# Patient Record
Sex: Male | Born: 1955 | Race: White | Hispanic: No | Marital: Single | Smoking: Current every day smoker
Health system: Southern US, Community
[De-identification: ages and names within clinical notes are randomized; demographics above are authoritative.]

## PROBLEM LIST (undated history)

## (undated) DIAGNOSIS — J189 Pneumonia, unspecified organism: Secondary | ICD-10-CM

## (undated) DIAGNOSIS — Z79891 Long term (current) use of opiate analgesic: Secondary | ICD-10-CM

## (undated) DIAGNOSIS — I10 Essential (primary) hypertension: Secondary | ICD-10-CM

## (undated) DIAGNOSIS — Z87442 Personal history of urinary calculi: Secondary | ICD-10-CM

## (undated) DIAGNOSIS — S060X9A Concussion with loss of consciousness of unspecified duration, initial encounter: Secondary | ICD-10-CM

## (undated) DIAGNOSIS — C61 Malignant neoplasm of prostate: Secondary | ICD-10-CM

## (undated) DIAGNOSIS — S060XAA Concussion with loss of consciousness status unknown, initial encounter: Secondary | ICD-10-CM

## (undated) HISTORY — PX: OTHER SURGICAL HISTORY: SHX169

## (undated) HISTORY — PX: WISDOM TOOTH EXTRACTION: SHX21

## (undated) HISTORY — PX: ADENOIDECTOMY: SUR15

## (undated) HISTORY — PX: TONSILLECTOMY: SUR1361

## (undated) HISTORY — PX: PROSTATE BIOPSY: SHX241

---

## 1978-08-04 HISTORY — PX: OTHER SURGICAL HISTORY: SHX169

## 2010-08-04 DIAGNOSIS — M4846XA Fatigue fracture of vertebra, lumbar region, initial encounter for fracture: Secondary | ICD-10-CM

## 2010-08-04 HISTORY — DX: Fatigue fracture of vertebra, lumbar region, initial encounter for fracture: M48.46XA

## 2016-04-10 ENCOUNTER — Ambulatory Visit
Admit: 2016-04-10 | Discharge: 2016-04-10 | Payer: PRIVATE HEALTH INSURANCE | Attending: Family Medicine | Primary: Family Medicine

## 2016-04-10 DIAGNOSIS — M545 Low back pain, unspecified: Secondary | ICD-10-CM

## 2016-04-10 MED ORDER — TERAZOSIN 2 MG CAP
2 mg | ORAL_CAPSULE | Freq: Every evening | ORAL | 2 refills | Status: AC
Start: 2016-04-10 — End: ?

## 2016-04-10 MED ORDER — TRAZODONE 100 MG TAB
100 mg | ORAL_TABLET | Freq: Every evening | ORAL | 1 refills | Status: AC
Start: 2016-04-10 — End: ?

## 2016-04-10 MED ORDER — BUPROPION XL 300 MG 24 HR TAB
300 mg | ORAL_TABLET | ORAL | 1 refills | Status: AC
Start: 2016-04-10 — End: ?

## 2016-04-10 NOTE — Patient Instructions (Signed)
Call [employer] at [phone]. They will screen you for any insurance you might qualify for. This is the first step before you can receive discounts at the hospital or [facility] specialists.     Additional contact numbers for APA are below:  For [place] patients: [phone]  For [place] patients: [phone]  For [place] patients: [phone]

## 2016-04-10 NOTE — Progress Notes (Signed)
No chief complaint on file.    1. When and where did you last receive medical care? Yes When: 01/2016 [facility] Reason for visit: PCP    2.When and where did you last have preventive care such as mammogram, pap smears or colon screening? Colon: No    3. What is your current living situation (for example, live alone, live in home with immediate family members[place]    4. Do you have any problems with communication such trouble seeing, hearing, or understanding instructions?Yes - Hearing([place] ears)    5. Do you have an advance directive?  This is a document that you can give to family members with instructions for how you would want them to make health care decisions for you if you were unable to speak for yourself.  (For example, unconscious, delerious)No    PMH/FH/Social Hx reviewed and updated as needed     Applicable screenings reviewed and updated as needed  Medication reconciliation performed. Patient does need medication refills.  Health Maintenance reviewed.
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
## 2016-04-10 NOTE — Progress Notes (Signed)
Discharge instructions reviewed with patient    Medication list and understanding of medications reviewed with patient.   OTC and herbal medications reviewed and added to med list if applicable  Barriers to adherence assessed.    Guidance given regarding new medications this visit, including reason for taking this medicine, and common side effects.    Appointment made for MRI of Lumbar spine, given info on [employer] insurance and [facility] resources, also [facility] [employer] , Given AVS after signing a ROI for medical records from [facility] [employer]Marland Kitchen

## 2016-04-16 ENCOUNTER — Inpatient Hospital Stay: Admit: 2016-04-16 | Payer: Self-pay | Attending: Family Medicine | Primary: Family Medicine

## 2016-04-16 DIAGNOSIS — M4856XA Collapsed vertebra, not elsewhere classified, lumbar region, initial encounter for fracture: Secondary | ICD-10-CM

## 2016-04-16 NOTE — Progress Notes (Signed)
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  mulilevel foraminal narrowing.     Cervical xray 2012 showed mild DJD C4/5, C5/6 and C6/7  Thoracic spine xray 2012 shows mild scoliosis  Also CT head, CT abdomen pelvis, KUB and various CXR    All placed in scan queue for "media" tab

## 2016-04-24 ENCOUNTER — Ambulatory Visit
Admit: 2016-04-24 | Discharge: 2016-04-24 | Payer: PRIVATE HEALTH INSURANCE | Attending: Family Medicine | Primary: Family Medicine

## 2016-04-24 DIAGNOSIS — M545 Low back pain, unspecified: Secondary | ICD-10-CM

## 2016-04-24 NOTE — Progress Notes (Signed)
HPI  Kenneth Cordova is a 60 y.o. male being seen today for   Chief Complaint   Patient presents with   ??? Generalized Body Aches   .  he states that he has 2 days of body aches and headache.  Some cough.  No fever. +nasal drainage.   He states he got a flu shot in June.     Dizzy a few times last week when stood from sitting position.  He did start terazosin and iis is helping his urinarion issues.     Still with back pain. Prevents him from walking more than 30min but he is trying to walk and lose weight.  Mri showed diffuse djd and foraminal narrowing as well as old compression fracture.      Past Medical History:   Diagnosis Date   ??? Arthritis    ??? Chronic pain    ??? Depression    ??? Hypertension 2014   ??? Polycythemia 2015         ROS  Patient states that he is feeling well. Denies complaints of chest pain, shortness of breath, swelling of legs, dizziness or weakness. he denies nausea, vomiting or diarrhea.        Current Outpatient Prescriptions   Medication Sig   ??? nebivolol (BYSTOLIC) 20 mg tablet Take  by mouth daily.   ??? ibuprofen (MOTRIN) 800 mg tablet Take 800 mg by mouth three (3) times daily.   ??? busPIRone (BUSPAR) 10 mg tablet Take 10 mg by mouth three (3) times daily.   ??? citalopram (CELEXA) 40 mg tablet Take 40 mg by mouth daily.   ??? lidocaine (LIDODERM) 5 % by TransDERmal route every twenty-four (24) hours. Apply patch to the affected area for 12 hours a day and remove for 12 hours a day.   ??? dextroamphetamine-amphetamine (ADDERALL) 20 mg tablet Take 20 mg by mouth two (2) times a day.   ??? buPROPion XL (WELLBUTRIN XL) 300 mg XL tablet Take 1 Tab by mouth every morning.   ??? traZODone (DESYREL) 100 mg tablet Take 1 Tab by mouth nightly.   ??? terazosin (HYTRIN) 2 mg capsule Take 1 Cap by mouth nightly.     No current facility-administered medications for this visit.        PE  Visit Vitals   ??? BP (!) 162/94 (BP 1 Location: Left arm, BP Patient Position: Sitting)   ??? Pulse 67    ??? Temp 97.9 ??F (36.6 ??C) (Oral)   ??? Resp 16   ??? Ht 6\' 3"  (1.905 m)   ??? Wt 289 lb (131.1 kg)   ??? SpO2 98%   ??? BMI 36.12 kg/m2        Alert and oriented with normal mood and affect. he is well developed and well nourished . Lungs are clear without wheezing. Heart rate is regular without murmurs or gallops. There is no lower extremity edema.     No results found for this or any previous visit.      Assessment and Plan:        ICD-10-CM ICD-9-CM    1. Low back pain at multiple sites M54.5 724.2    2. Viral upper respiratory tract infection  Should be self limited J06.9 465.9     B97.89     3. Benign prostatic hyperplasia, presence of lower urinary tract symptoms unspecified, unspecified morphology  Improved with terazosin.  Dizziness is probably med side effect but should resolve. N40.0 600.00  4. Essential hypertension I10 401.9        apa for financial screening and then will refer to spine centter  Unclear if there are surgical options and I discussed that with him.   Work hard on weight loss which I think will help him the most    Continue terazosin and monitor his dizzy spells for now.    Donnie MesaEmily W Shivangi Lutz, MD

## 2016-04-24 NOTE — Patient Instructions (Signed)
Call [employer] Patient [employer]ocacy at [phone]. They will screen you for any insurance you might qualify for. This is the first step before you can receive discounts at the hospital or [facility] specialists.     Additional contact numbers for APA are below:  For [place] patients: [phone]  For [place] patients: [phone]  For [place] patients: [phone]

## 2016-04-24 NOTE — Progress Notes (Signed)
No chief complaint on file.    1. Have you been to the ER, urgent care clinic since your last visit?  Hospitalized since your last visit?No    2. Have you seen or consulted any other health care providers outside of the [facility] since your last visit? No    3.   When was your last Pap smear? No  When was your last Mammogram? No  When was your last Colon screening?No    PMH/FH/Social Hx reviewed and updated as needed      Applicable screenings reviewed and updated as needed  Medication reconciliation performed. Patient does need medication refills.  Health Maintenance reviewed.

## 2016-04-25 MED ORDER — GABAPENTIN 300 MG CAP
300 mg | ORAL_CAPSULE | Freq: Three times a day (TID) | ORAL | 2 refills | Status: AC
Start: 2016-04-25 — End: ?

## 2016-05-01 DIAGNOSIS — Z8679 Personal history of other diseases of the circulatory system: Secondary | ICD-10-CM

## 2016-05-01 HISTORY — DX: Personal history of other diseases of the circulatory system: Z86.79

## 2016-06-05 ENCOUNTER — Ambulatory Visit
Admit: 2016-06-05 | Discharge: 2016-06-05 | Payer: PRIVATE HEALTH INSURANCE | Attending: Family Medicine | Primary: Family Medicine

## 2016-06-05 NOTE — Progress Notes (Signed)
A user error has taken place: encounter opened in error, closed for administrative reasons.

## 2019-12-08 ENCOUNTER — Other Ambulatory Visit: Payer: Self-pay | Admitting: Urology

## 2019-12-08 DIAGNOSIS — C61 Malignant neoplasm of prostate: Secondary | ICD-10-CM

## 2020-01-04 ENCOUNTER — Other Ambulatory Visit: Payer: Self-pay | Admitting: Urology

## 2020-01-04 DIAGNOSIS — C61 Malignant neoplasm of prostate: Secondary | ICD-10-CM

## 2020-01-04 DIAGNOSIS — C779 Secondary and unspecified malignant neoplasm of lymph node, unspecified: Secondary | ICD-10-CM

## 2020-04-27 ENCOUNTER — Ambulatory Visit
Admission: RE | Admit: 2020-04-27 | Discharge: 2020-04-27 | Disposition: A | Discharge status: Home / Self Care | Payer: Medicare PPO | Source: Ambulatory Visit | Attending: Urology | Admitting: Urology

## 2020-04-27 ENCOUNTER — Other Ambulatory Visit: Payer: Self-pay | Admitting: Urology

## 2020-04-27 ENCOUNTER — Other Ambulatory Visit: Payer: Self-pay

## 2020-04-27 DIAGNOSIS — C61 Malignant neoplasm of prostate: Secondary | ICD-10-CM

## 2020-04-27 DIAGNOSIS — C779 Secondary and unspecified malignant neoplasm of lymph node, unspecified: Secondary | ICD-10-CM

## 2020-04-27 MED ORDER — GADOBENATE DIMEGLUMINE 529 MG/ML IV SOLN
20.0000 mL | Freq: Once | INTRAVENOUS | Status: AC | PRN
  Administered 2020-04-27: 20 mL via INTRAVENOUS

## 2020-05-03 ENCOUNTER — Other Ambulatory Visit: Payer: Medicare PPO

## 2020-05-03 ENCOUNTER — Ambulatory Visit
Admission: RE | Admit: 2020-05-03 | Discharge: 2020-05-03 | Disposition: A | Discharge status: Home / Self Care | Payer: Medicare PPO | Source: Ambulatory Visit | Attending: Urology | Admitting: Urology

## 2020-05-03 ENCOUNTER — Other Ambulatory Visit: Payer: Self-pay

## 2020-05-03 DIAGNOSIS — C779 Secondary and unspecified malignant neoplasm of lymph node, unspecified: Secondary | ICD-10-CM

## 2020-05-03 DIAGNOSIS — C61 Malignant neoplasm of prostate: Secondary | ICD-10-CM

## 2020-05-03 MED ORDER — GADOBENATE DIMEGLUMINE 529 MG/ML IV SOLN
20.0000 mL | Freq: Once | INTRAVENOUS | Status: AC | PRN
  Administered 2020-05-03: 20 mL via INTRAVENOUS

## 2020-10-08 ENCOUNTER — Encounter: Payer: Self-pay | Admitting: Radiation Oncology

## 2020-10-08 DIAGNOSIS — D751 Secondary polycythemia: Secondary | ICD-10-CM | POA: Insufficient documentation

## 2020-10-08 DIAGNOSIS — F329 Major depressive disorder, single episode, unspecified: Secondary | ICD-10-CM | POA: Insufficient documentation

## 2020-10-08 DIAGNOSIS — F99 Mental disorder, not otherwise specified: Secondary | ICD-10-CM | POA: Insufficient documentation

## 2020-10-08 DIAGNOSIS — F988 Other specified behavioral and emotional disorders with onset usually occurring in childhood and adolescence: Secondary | ICD-10-CM | POA: Insufficient documentation

## 2020-10-08 DIAGNOSIS — F5105 Insomnia due to other mental disorder: Secondary | ICD-10-CM | POA: Insufficient documentation

## 2020-10-08 DIAGNOSIS — F418 Other specified anxiety disorders: Secondary | ICD-10-CM | POA: Insufficient documentation

## 2020-10-08 DIAGNOSIS — Z79891 Long term (current) use of opiate analgesic: Secondary | ICD-10-CM | POA: Insufficient documentation

## 2020-10-08 DIAGNOSIS — F39 Unspecified mood [affective] disorder: Secondary | ICD-10-CM | POA: Insufficient documentation

## 2020-10-08 DIAGNOSIS — G4733 Obstructive sleep apnea (adult) (pediatric): Secondary | ICD-10-CM | POA: Insufficient documentation

## 2020-10-08 DIAGNOSIS — M5430 Sciatica, unspecified side: Secondary | ICD-10-CM | POA: Insufficient documentation

## 2020-10-08 DIAGNOSIS — M549 Dorsalgia, unspecified: Secondary | ICD-10-CM | POA: Insufficient documentation

## 2020-10-08 DIAGNOSIS — G4700 Insomnia, unspecified: Secondary | ICD-10-CM | POA: Insufficient documentation

## 2020-10-08 DIAGNOSIS — E669 Obesity, unspecified: Secondary | ICD-10-CM | POA: Insufficient documentation

## 2020-10-08 DIAGNOSIS — C61 Malignant neoplasm of prostate: Secondary | ICD-10-CM | POA: Insufficient documentation

## 2020-10-08 DIAGNOSIS — M810 Age-related osteoporosis without current pathological fracture: Secondary | ICD-10-CM | POA: Insufficient documentation

## 2020-10-08 DIAGNOSIS — F17200 Nicotine dependence, unspecified, uncomplicated: Secondary | ICD-10-CM | POA: Insufficient documentation

## 2020-10-08 DIAGNOSIS — E559 Vitamin D deficiency, unspecified: Secondary | ICD-10-CM | POA: Insufficient documentation
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
## 2020-10-09 ENCOUNTER — Encounter: Payer: Self-pay | Admitting: Radiation Oncology

## 2020-10-09 ENCOUNTER — Other Ambulatory Visit: Payer: Self-pay

## 2020-10-09 ENCOUNTER — Ambulatory Visit
Admission: RE | Admit: 2020-10-09 | Discharge: 2020-10-09 | Disposition: A | Discharge status: Home / Self Care | Payer: Medicare PPO | Source: Ambulatory Visit | Attending: Radiation Oncology | Admitting: Radiation Oncology

## 2020-10-09 VITALS — BP 156/79 | HR 110 | Temp 97.2°F | Resp 22 | Ht 75.0 in | Wt 275.1 lb

## 2020-10-09 DIAGNOSIS — Z79899 Other long term (current) drug therapy: Secondary | ICD-10-CM | POA: Diagnosis not present

## 2020-10-09 DIAGNOSIS — Z808 Family history of malignant neoplasm of other organs or systems: Secondary | ICD-10-CM | POA: Diagnosis not present

## 2020-10-09 DIAGNOSIS — C61 Malignant neoplasm of prostate: Secondary | ICD-10-CM | POA: Insufficient documentation

## 2020-10-09 HISTORY — DX: Malignant neoplasm of prostate: C61
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
## 2020-10-10 ENCOUNTER — Encounter: Payer: Self-pay | Admitting: Licensed Clinical Social Worker

## 2020-10-10 NOTE — Progress Notes (Signed)
[facility] Psychosocial Distress Screening Clinical Social Work  Clinical Social Work was referred by distress screening protocol.  The patient scored a 8 on the Psychosocial Distress Thermometer which indicates severe distress. Clinical Social Worker attempted to contact patient by phone to assess for distress and other psychosocial needs.   No answer. Left detailed message including direct contact information on identified voicemail.  ON[facility]N DISTRESS SCREENING 10/09/2020  Screening Type Initial Screening  Distress experienced in past week (1-10) 8  Practical problem type Housing;Insurance  Family Problem type Other (comment)  Emotional problem type Depression;Nervousness/Anxiety;Adjusting to illness;Isolation/feeling alone;Boredom  Spiritual/Religous concerns type Facing my mortality;Loss of sense of purpose  Information Concerns Type Lack of info about treatment;Lack of info about maintaining fitness  Physical Problem type Nausea/vomiting;Pain;Sleep/insomnia;Breathing;Sexual problems  Physician notified of physical symptoms Yes  Referral to clinical social work Yes  Referral to dietition No  Referral to financial advocate No  Referral to support programs Yes  Referral to palliative care No      [place], LCSW

## 2020-10-11 NOTE — Progress Notes (Signed)
[facility] Clinical Social Work   Second attempt to reach patient by phone. No answer. Left VM with direct contact information.   Christeen Douglas, LCSW

## 2020-10-16 ENCOUNTER — Telehealth: Payer: Self-pay | Admitting: *Deleted

## 2020-10-16 NOTE — Telephone Encounter (Signed)
CALLED PATIENT TO UPDATE, LVM FOR A RETURN CALL 

## 2020-11-28 ENCOUNTER — Telehealth: Payer: Self-pay | Admitting: *Deleted

## 2020-11-28 ENCOUNTER — Other Ambulatory Visit: Payer: Self-pay | Admitting: Urology

## 2020-11-28 NOTE — Telephone Encounter (Signed)
CALLED PATIENT TO REMIND OF PRE-SEED APPTS.FOR 11-29-20, SPOKE WITH PATIENT AND HE IS AWARE OF THESE APPTS. 

## 2020-11-29 ENCOUNTER — Ambulatory Visit
Admission: RE | Admit: 2020-11-29 | Discharge: 2020-11-29 | Disposition: A | Discharge status: Home / Self Care | Payer: Medicare PPO | Source: Ambulatory Visit | Attending: Urology | Admitting: Urology

## 2020-11-29 ENCOUNTER — Ambulatory Visit
Admission: RE | Admit: 2020-11-29 | Discharge: 2020-11-29 | Disposition: A | Discharge status: Home / Self Care | Payer: Medicare PPO | Source: Ambulatory Visit | Attending: Radiation Oncology | Admitting: Radiation Oncology

## 2020-11-29 ENCOUNTER — Ambulatory Visit (HOSPITAL_COMMUNITY)
Admission: RE | Admit: 2020-11-29 | Discharge: 2020-11-29 | Disposition: A | Discharge status: Home / Self Care | Payer: Medicare PPO | Source: Ambulatory Visit | Attending: Urology | Admitting: Urology

## 2020-11-29 ENCOUNTER — Encounter (HOSPITAL_COMMUNITY)
Admission: RE | Admit: 2020-11-29 | Discharge: 2020-11-29 | Disposition: A | Discharge status: Home / Self Care | Payer: Medicare PPO | Source: Ambulatory Visit | Attending: Urology | Admitting: Urology

## 2020-11-29 ENCOUNTER — Other Ambulatory Visit: Payer: Self-pay

## 2020-11-29 DIAGNOSIS — C61 Malignant neoplasm of prostate: Secondary | ICD-10-CM | POA: Diagnosis present

## 2020-11-29 DIAGNOSIS — Z01818 Encounter for other preprocedural examination: Secondary | ICD-10-CM | POA: Insufficient documentation

## 2020-11-29 NOTE — Progress Notes (Signed)
  Radiation Oncology         [phone] ________________________________  Name: Russell Irwin MRN: [id number]  Date: 11/29/2020  DOB: Jun 30, 1956  SIMULATION AND TREATMENT PLANNING NOTE PUBIC ARCH STUDY  CC:Pcp, No  [name], MD  DIAGNOSIS: 65 y.o. gentleman with Stage T1c adenocarcinoma of the prostate with Gleason score of 3+4, and PSA of 3.68.  Oncology History  Malignant neoplasm of prostate ([place])  07/02/2020 Cancer Staging   Staging form: Prostate, AJCC 8th Edition - Clinical stage from 07/02/2020: Stage IIB (cT1c, cN0, cM0, PSA: 3.2, Grade Group: 2) - Signed by [name], PA-C on 10/11/2020 Histopathologic type: Adenocarcinoma, NOS Stage prefix: Initial diagnosis Prostate specific antigen (PSA) range: Less than 10 Gleason primary pattern: 3 Gleason secondary pattern: 4 Gleason score: 7 Histologic grading system: 5 grade system Number of biopsy cores examined: 16 Number of biopsy cores positive: 7 Location of positive needle core biopsies: One side   10/08/2020 Initial Diagnosis   Malignant neoplasm of prostate ([place])       ICD-10-CM   1. Malignant neoplasm of prostate ([place])  C61     COMPLEX SIMULATION:  The patient presented today for evaluation for possible prostate seed implant. He was brought to the radiation planning suite and placed supine on the CT couch. A 3-dimensional image study set was obtained in upload to the planning computer. There, on each axial slice, I contoured the prostate gland. Then, using three-dimensional radiation planning tools I reconstructed the prostate in view of the structures from the transperineal needle pathway to assess for possible pubic arch interference. In doing so, I did not appreciate any pubic arch interference. Also, the patient's prostate volume was estimated based on the drawn structure. The volume was 34 cc.  Given the pubic arch appearance and prostate volume, patient remains a good candidate to proceed with prostate  seed implant. Today, he freely provided informed written consent to proceed.    PLAN: The patient will undergo prostate seed implant.   ________________________________  Sheral Apley. Tammi Klippel, M.D.

## 2021-01-16 ENCOUNTER — Telehealth: Payer: Self-pay | Admitting: *Deleted

## 2021-01-16 NOTE — Telephone Encounter (Signed)
CALLED PATIENT TO REMIND OF LABS FOR 01-21-21, SPOKE WITH PATIENT AND HE IS AWARE OF THIS APPT.

## 2021-01-21 ENCOUNTER — Encounter (HOSPITAL_COMMUNITY)
Admission: RE | Admit: 2021-01-21 | Discharge: 2021-01-21 | Disposition: A | Discharge status: Home / Self Care | Payer: Medicare PPO | Source: Ambulatory Visit | Attending: Urology | Admitting: Urology

## 2021-01-21 ENCOUNTER — Encounter (HOSPITAL_BASED_OUTPATIENT_CLINIC_OR_DEPARTMENT_OTHER): Payer: Self-pay | Admitting: Urology

## 2021-01-21 ENCOUNTER — Other Ambulatory Visit: Payer: Self-pay

## 2021-01-21 DIAGNOSIS — M549 Dorsalgia, unspecified: Secondary | ICD-10-CM

## 2021-01-21 DIAGNOSIS — G8929 Other chronic pain: Secondary | ICD-10-CM

## 2021-01-21 DIAGNOSIS — Z01812 Encounter for preprocedural laboratory examination: Secondary | ICD-10-CM | POA: Diagnosis not present

## 2021-01-21 DIAGNOSIS — M4306 Spondylolysis, lumbar region: Secondary | ICD-10-CM

## 2021-01-21 HISTORY — DX: Other chronic pain: G89.29

## 2021-01-21 HISTORY — DX: Spondylolysis, lumbar region: M43.06

## 2021-01-21 HISTORY — DX: Dorsalgia, unspecified: M54.9

## 2021-01-21 LAB — CBC
HCT: 55.9 % — ABNORMAL HIGH (ref 39.0–52.0)
Hemoglobin: 18.7 g/dL — ABNORMAL HIGH (ref 13.0–17.0)
MCH: 32.5 pg (ref 26.0–34.0)
MCHC: 33.5 g/dL (ref 30.0–36.0)
MCV: 97.2 fL (ref 80.0–100.0)
Platelets: 173 10*3/uL (ref 150–400)
RBC: 5.75 MIL/uL (ref 4.22–5.81)
RDW: 13.5 % (ref 11.5–15.5)
WBC: 6.6 10*3/uL (ref 4.0–10.5)
nRBC: 0 % (ref 0.0–0.2)

## 2021-01-21 LAB — COMPREHENSIVE METABOLIC PANEL
ALT: 23 U/L (ref 0–44)
AST: 16 U/L (ref 15–41)
Albumin: 4.3 g/dL (ref 3.5–5.0)
Alkaline Phosphatase: 83 U/L (ref 38–126)
Anion gap: 5 (ref 5–15)
BUN: 16 mg/dL (ref 8–23)
CO2: 32 mmol/L (ref 22–32)
Calcium: 9.2 mg/dL (ref 8.9–10.3)
Chloride: 100 mmol/L (ref 98–111)
Creatinine, Ser: 0.9 mg/dL (ref 0.61–1.24)
GFR, Estimated: >60 mL/min (ref >60)
Glucose, Bld: 127 mg/dL — ABNORMAL HIGH (ref 70–99)
Potassium: 5.2 mmol/L — ABNORMAL HIGH (ref 3.5–5.1)
Sodium: 137 mmol/L (ref 135–145)
Total Bilirubin: 0.4 mg/dL (ref 0.3–1.2)
Total Protein: 7.7 g/dL (ref 6.5–8.1)

## 2021-01-21 LAB — PROTIME-INR
INR: 1 (ref 0.8–1.2)
Prothrombin Time: 13 seconds (ref 11.4–15.2)

## 2021-01-21 LAB — APTT: aPTT: 30 seconds (ref 24–36)

## 2021-01-22 ENCOUNTER — Other Ambulatory Visit: Payer: Self-pay

## 2021-01-22 ENCOUNTER — Encounter (HOSPITAL_BASED_OUTPATIENT_CLINIC_OR_DEPARTMENT_OTHER): Payer: Self-pay | Admitting: Urology

## 2021-01-22 DIAGNOSIS — D751 Secondary polycythemia: Secondary | ICD-10-CM

## 2021-01-22 HISTORY — DX: Secondary polycythemia: D75.1

## 2021-01-22 NOTE — Progress Notes (Addendum)
Spoke w/ via phone for pre-op interview---pt Lab needs dos----     none          Lab results------chest xray 11-29-2020 epic, ekg 11-29-2020 epic, cbc, cmp, pt, ptt 01-21-2021 epic COVID test -----patient states asymptomatic no test needed Arrive at -------1045 am 01-24-2021 NPO after MN NO Solid Food.  Clear liquids from MN until---945 am then npo Med rec completed Medications to take morning of surgery -----amlodipine, clonidine Diabetic medication ----- Patient instructed no nail polish to be worn day of surgery Patient instructed to bring photo id and insurance card day of surgery Patient aware to have Driver (ride ) / caregiver   son christopher  for 24 hours after surgery  Patient Special Instructions -----fleets enema am of surgery Pre-Op special Istructions ----- no smoking 24 hours before surgery Patient verbalized understanding of instructions that were given at this phone interview. Patient denies shortness of breath, chest pain, fever, cough at this phone interview.

## 2021-01-23 ENCOUNTER — Telehealth: Payer: Self-pay | Admitting: *Deleted

## 2021-01-23 NOTE — H&P (Signed)
Prostate cancer   Mr. Russell Irwin was initially found to have an elevated PSA of 6.8. This prompted a TRUS biopsy of the prostate on 09/08/19 that confirmed Gleason 3+3=6 adenocarcinoma in 2 out of 12 biopsy cores performed by Dr. Lerry Liner consistent with very low risk prostate cancer. He was seen by me in consultation in May 2021 and elected active surveillance management. He had an MRI of the prostate on 04/27/20 that indicated a 1.4 cm PIRADS 4 lesion of the right lateral base. MR/US fusion biopsy was performed and confirmed upgraded Gleason 3+4=7 disease with 7 out of 16 cores positive for malignancy including 4 out of 4 targeted biopsies.   PMH: He has a history of obesity (275 lbs), hypertension, and depression. He also has a history of polycythemia vera diagnosed approximately 5 years ago. He periodically will donate blood and is monitored by his primary care provider. He was initially seen by [facility] Hematology. Interestingly, he had been on testosterone replacement therapy until about 2016 when he stopped treatment.  PSH: No abdominal surgeries.   Initial diagnosis: February 2021  TNM stage: cT1c N0 M0  PSA: 6.8  Gleason score: 3+3=6  Biopsy (09/08/19, read by Dr. Mathis Fare, Acc# [phone], [facility]): /12 cores positive  Left: Benign  Right: R lateral apex (15%, 3+3=6), R lateral base (40%, 3+3=6)  Prostate volume: 31 cc  PSAD: 0.22   Surveillance:  TNM stage: cT1c N0 Mx  PSA: 3.21  Gleason score: 3+4=7 (GG 2)  Biopsy (07/02/20): 7/16 cores  Left: Benign  Right: R apex (20%, 3+4=7), R mid (30%, 3+3=6), R lateral base (30%, 3+3=6)  Targeted: 4/4 cores (70%, 70%, 60%, 50%, 3+4=7)  Prostate volume: 32.9 cc   Baseline urinary function: IPSS is 5.  Baseline erectile function: SHIM score is 12.   He was incidentally noted to have retroperitoneal and pelvic lymphadenopathy on an initial staging CT scan in [place]. He was incidentally found to have lymphadenopathy on a staging CT for his low risk  prostate cancer by his urologist in [place]. Repeat imaging with an abdominal and pelvic MRI in September 2021 indicated no enlarging or particularly worrisome lymphadenopathy.    Interval history: He presents today after his recently noted upgrade prostate cancer. We unfortunately had 2 scheduled visits that were cancel due to weather. He remains in stable overall health. He has considered to reading become educated about his treatment options for prostate cancer. He is fairly well informed at this time.     ALLERGIES: Penicillin    MEDICATIONS: Diazepam 5 mg tablet 1 tablet PO Q HS PRN  Diazepam 10 mg tablet Take 10 mg 30-60 minutes prior to your procedure  Diazepam 10 mg tablet 1 tablet PO 30-60 minutes prior to procedure  Levofloxacin 750 mg tablet Please take one tablet the morning of your biopsy.  Adderall 20 mg tablet  Amlodipine Besylate 5 mg tablet  Wellbutrin Xl 150 mg tablet, extended release 24 hr     GU PSH: Prostate Needle Biopsy - 07/02/2020       PSH Notes: Chest surgery (repair) 1978   NON-GU PSH: Surgical Pathology, Gross And Microscopic Examination For Prostate Needle - 07/02/2020 Tonsillectomy     GU PMH: Prostate Cancer - 07/02/2020, - 05/25/2020, - 12/06/2019      PMH Notes:   1) Prostate cancer: He was found to have an elevated PSA of 6.8. This prompted a TRUS biopsy of the prostate on 09/08/19 that confirmed Gleason 3+3=6 adenocarcinoma in 2 out of 12  biopsy cores performed by Dr. Lerry Liner. He was seen by me in consultation in May 2021 and elected active surveillance management.   PMH: He has a history of obesity (275 lbs), hypertension, and depression. He also has a history of polycythemia vera diagnosed approximately 5 years ago. He periodically will donate blood and is monitored by his primary care provider. He was initially seen by [facility] Hematology. Interestingly, he had been on testosterone replacement therapy until about 2016 when he stopped treatment.   PSH: No abdominal surgeries.   Initial diagnosis: February 2021  TNM stage: cT1c N0 M0  PSA: 6.8  Gleason score: 3+3=6  Biopsy (09/08/19, read by Dr. Mathis Fare, Acc# [phone], [facility]): /12 cores positive  Left: Benign  Right: R lateral apex (15%, 3+3=6), R lateral base (40%, 3+3=6)  Prostate volume: 31 cc  PSAD: 0.22   Surveillance:   Baseline urinary function: IPSS is 5.  Baseline erectile function: SHIM score is 12.   2) Retroperitoneal and pelvic lymphadenopathy: He was incidentally found to have lymphadenopathy on a staging CT for his low risk prostate cancer by his urologist in [place].   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 Has smoked since 12/02/2009. Smokes 1 pack per day.   Tobacco Use Assessment Completed: Used Tobacco in last 30 days? Drinks 1 drink per day. Social Drinker.  Drinks 1 caffeinated drink per day.    REVIEW OF SYSTEMS:    GU Review Male:   Patient denies frequent urination, hard to postpone urination, burning/ pain with urination, get up at night to urinate, leakage of urine, stream starts and stops, trouble starting your streams, and have to strain to urinate .  Gastrointestinal (Lower):   Patient denies diarrhea and constipation.  Gastrointestinal (Upper):   Patient denies nausea and vomiting.  Constitutional:   Patient denies fever, night sweats, weight loss, and fatigue.  Skin:   Patient denies skin rash/  lesion and itching.  Eyes:   Patient denies blurred vision and double vision.  Ears/ Nose/ Throat:   Patient denies sore throat and sinus problems.  Hematologic/Lymphatic:   Patient denies swollen glands and easy bruising.  Cardiovascular:   Patient denies leg swelling and chest pains.  Respiratory:   Patient denies cough and shortness of breath.  Endocrine:   Patient denies excessive thirst.  Musculoskeletal:   Patient denies back pain and joint pain.  Neurological:   Patient denies headaches and dizziness.  Psychologic:   Patient denies depression and anxiety.   VITAL SIGNS:     Weight 275 lb / 124.74 kg  Height 75 in / 190.5 cm  BMI 34.4 kg/m    MULTI-SYSTEM PHYSICAL EXAMINATION:    Constitutional: Well-nourished. No physical deformities. Normally developed. Good grooming.  CV: RRR Lungs: Clear    Complexity of Data:  Lab Test Review:   PSA  Records Review:   Pathology Reports, Previous Patient Records   04/24/20  PSA  Total PSA 3.21 ng/mL      ASSESSMENT:  ICD-10 Details  1 GU:   Prostate Cancer - C61    PLAN:      1. Prostate cancer:   He is agreeable that he needs to proceed with definitive therapy. He appears to be most interested in brachytherapy and would appear to be an appropriate candidate for this treatment based on his disease parameters, prostate size, and minimal lower urinary tract symptoms.  He has made the decision to proceed with treatment with brachytherapy.

## 2021-01-23 NOTE — Telephone Encounter (Signed)
CALLED PATIENT TO REMIND OF PROCEDURE FOR 01-24-21, SPOKE WITH PATIENT AND HE IS AWARE OF THIS PROCEDURE

## 2021-01-24 ENCOUNTER — Ambulatory Visit (HOSPITAL_BASED_OUTPATIENT_CLINIC_OR_DEPARTMENT_OTHER): Payer: Medicare PPO | Admitting: Anesthesiology

## 2021-01-24 ENCOUNTER — Ambulatory Visit (HOSPITAL_COMMUNITY): Payer: Medicare PPO

## 2021-01-24 ENCOUNTER — Encounter (HOSPITAL_BASED_OUTPATIENT_CLINIC_OR_DEPARTMENT_OTHER): Payer: Self-pay | Admitting: Urology

## 2021-01-24 ENCOUNTER — Other Ambulatory Visit: Payer: Self-pay

## 2021-01-24 ENCOUNTER — Encounter (HOSPITAL_BASED_OUTPATIENT_CLINIC_OR_DEPARTMENT_OTHER)
Admission: RE | Disposition: A | Discharge status: Home / Self Care | Payer: Self-pay | Source: Ambulatory Visit | Attending: Urology

## 2021-01-24 ENCOUNTER — Ambulatory Visit (HOSPITAL_BASED_OUTPATIENT_CLINIC_OR_DEPARTMENT_OTHER)
Admission: RE | Admit: 2021-01-24 | Discharge: 2021-01-24 | Disposition: A | Discharge status: Home / Self Care | Payer: Medicare PPO | Source: Ambulatory Visit | Attending: Urology | Admitting: Urology

## 2021-01-24 DIAGNOSIS — Z880 Allergy status to penicillin: Secondary | ICD-10-CM | POA: Insufficient documentation

## 2021-01-24 DIAGNOSIS — F17210 Nicotine dependence, cigarettes, uncomplicated: Secondary | ICD-10-CM | POA: Diagnosis not present

## 2021-01-24 DIAGNOSIS — C61 Malignant neoplasm of prostate: Secondary | ICD-10-CM | POA: Insufficient documentation

## 2021-01-24 DIAGNOSIS — R590 Localized enlarged lymph nodes: Secondary | ICD-10-CM | POA: Diagnosis not present

## 2021-01-24 DIAGNOSIS — Z79899 Other long term (current) drug therapy: Secondary | ICD-10-CM | POA: Insufficient documentation

## 2021-01-24 HISTORY — PX: RADIOACTIVE SEED IMPLANT: SHX5150

## 2021-01-24 HISTORY — DX: Essential (primary) hypertension: I10

## 2021-01-24 HISTORY — DX: Concussion with loss of consciousness status unknown, initial encounter: S06.0XAA

## 2021-01-24 HISTORY — PX: CYSTOSCOPY: SHX5120

## 2021-01-24 HISTORY — DX: Pneumonia, unspecified organism: J18.9

## 2021-01-24 HISTORY — DX: Personal history of urinary calculi: Z87.442

## 2021-01-24 HISTORY — DX: Concussion with loss of consciousness of unspecified duration, initial encounter: S06.0X9A

## 2021-01-24 HISTORY — DX: Long term (current) use of opiate analgesic: Z79.891

## 2021-01-24 HISTORY — PX: SPACE OAR INSTILLATION: SHX6769

## 2021-01-24 SURGERY — INSERTION, RADIATION SOURCE, PROSTATE
Anesthesia: General | Site: Rectum

## 2021-01-24 MED ORDER — PHENYLEPHRINE HCL-NACL 10-0.9 MG/250ML-% IV SOLN
INTRAVENOUS | Status: DC | PRN
  Administered 2021-01-24: 150 ug/min via INTRAVENOUS

## 2021-01-24 MED ORDER — FLEET ENEMA 7-19 GM/118ML RE ENEM: 1.0000 | ENEMA | Freq: Once | RECTAL | Status: DC

## 2021-01-24 MED ORDER — PHENYLEPHRINE 40 MCG/ML (10ML) SYRINGE FOR IV PUSH (FOR BLOOD PRESSURE SUPPORT)
PREFILLED_SYRINGE | INTRAVENOUS | Status: AC
  Filled 2021-01-24: qty 10

## 2021-01-24 MED ORDER — ONDANSETRON HCL 4 MG/2ML IJ SOLN
INTRAMUSCULAR | Status: DC | PRN
  Administered 2021-01-24: 4 mg via INTRAVENOUS

## 2021-01-24 MED ORDER — ONDANSETRON HCL 4 MG/2ML IJ SOLN
INTRAMUSCULAR | Status: AC
  Filled 2021-01-24: qty 2

## 2021-01-24 MED ORDER — OXYCODONE HCL 5 MG PO TABS
ORAL_TABLET | ORAL | Status: AC
  Filled 2021-01-24: qty 1

## 2021-01-24 MED ORDER — LACTATED RINGERS IV SOLN: INTRAVENOUS | Status: DC

## 2021-01-24 MED ORDER — DEXAMETHASONE SODIUM PHOSPHATE 10 MG/ML IJ SOLN
INTRAMUSCULAR | Status: DC | PRN
  Administered 2021-01-24: 10 mg via INTRAVENOUS

## 2021-01-24 MED ORDER — FENTANYL CITRATE (PF) 100 MCG/2ML IJ SOLN
INTRAMUSCULAR | Status: DC | PRN
  Administered 2021-01-24: 50 ug via INTRAVENOUS
  Administered 2021-01-24: 25 ug via INTRAVENOUS
  Administered 2021-01-24: 50 ug via INTRAVENOUS
  Administered 2021-01-24: 25 ug via INTRAVENOUS

## 2021-01-24 MED ORDER — PROPOFOL 10 MG/ML IV BOLUS
INTRAVENOUS | Status: AC
  Filled 2021-01-24: qty 20

## 2021-01-24 MED ORDER — IOHEXOL 300 MG/ML  SOLN
INTRAMUSCULAR | Status: DC | PRN
  Administered 2021-01-24: 7 mL

## 2021-01-24 MED ORDER — LIDOCAINE HCL (PF) 2 % IJ SOLN
INTRAMUSCULAR | Status: AC
  Filled 2021-01-24: qty 5

## 2021-01-24 MED ORDER — EPHEDRINE 5 MG/ML INJ
INTRAVENOUS | Status: AC
  Filled 2021-01-24: qty 10

## 2021-01-24 MED ORDER — CIPROFLOXACIN IN D5W 400 MG/200ML IV SOLN
400.0000 mg | INTRAVENOUS | Status: AC
  Administered 2021-01-24: 400 mg via INTRAVENOUS

## 2021-01-24 MED ORDER — TAMSULOSIN HCL 0.4 MG PO CAPS: 0.4000 mg | ORAL_CAPSULE | Freq: Every day | ORAL | 0 refills | Status: AC

## 2021-01-24 MED ORDER — FENTANYL CITRATE (PF) 100 MCG/2ML IJ SOLN
INTRAMUSCULAR | Status: AC
  Filled 2021-01-24: qty 2

## 2021-01-24 MED ORDER — SODIUM CHLORIDE (PF) 0.9 % IJ SOLN
INTRAMUSCULAR | Status: DC | PRN
  Administered 2021-01-24: 10 mL

## 2021-01-24 MED ORDER — PROPOFOL 10 MG/ML IV BOLUS
INTRAVENOUS | Status: DC | PRN
  Administered 2021-01-24: 20 mg via INTRAVENOUS
  Administered 2021-01-24: 200 mg via INTRAVENOUS

## 2021-01-24 MED ORDER — PHENYLEPHRINE HCL (PRESSORS) 10 MG/ML IV SOLN
INTRAVENOUS | Status: AC
  Filled 2021-01-24: qty 1

## 2021-01-24 MED ORDER — MIDAZOLAM HCL 2 MG/2ML IJ SOLN
INTRAMUSCULAR | Status: DC | PRN
  Administered 2021-01-24: 2 mg via INTRAVENOUS

## 2021-01-24 MED ORDER — SODIUM CHLORIDE 0.9 % IR SOLN
Status: DC | PRN
  Administered 2021-01-24: 1000 mL via INTRAVESICAL

## 2021-01-24 MED ORDER — FENTANYL CITRATE (PF) 100 MCG/2ML IJ SOLN: 25.0000 ug | INTRAMUSCULAR | Status: DC | PRN

## 2021-01-24 MED ORDER — ONDANSETRON HCL 4 MG/2ML IJ SOLN: 4.0000 mg | Freq: Once | INTRAMUSCULAR | Status: DC | PRN

## 2021-01-24 MED ORDER — OXYCODONE HCL 5 MG/5ML PO SOLN: 5.0000 mg | Freq: Once | ORAL | Status: AC | PRN

## 2021-01-24 MED ORDER — DOCUSATE SODIUM 100 MG PO CAPS: 100.0000 mg | ORAL_CAPSULE | Freq: Two times a day (BID) | ORAL | 0 refills | Status: AC

## 2021-01-24 MED ORDER — MIDAZOLAM HCL 2 MG/2ML IJ SOLN
INTRAMUSCULAR | Status: AC
  Filled 2021-01-24: qty 2

## 2021-01-24 MED ORDER — PHENYLEPHRINE 40 MCG/ML (10ML) SYRINGE FOR IV PUSH (FOR BLOOD PRESSURE SUPPORT)
PREFILLED_SYRINGE | INTRAVENOUS | Status: DC | PRN
  Administered 2021-01-24 (×2): 120 ug via INTRAVENOUS
  Administered 2021-01-24: 160 ug via INTRAVENOUS
  Administered 2021-01-24: 120 ug via INTRAVENOUS

## 2021-01-24 MED ORDER — EPHEDRINE SULFATE-NACL 50-0.9 MG/10ML-% IV SOSY
PREFILLED_SYRINGE | INTRAVENOUS | Status: DC | PRN
  Administered 2021-01-24 (×2): 10 mg via INTRAVENOUS

## 2021-01-24 MED ORDER — CIPROFLOXACIN IN D5W 400 MG/200ML IV SOLN
INTRAVENOUS | Status: AC
  Filled 2021-01-24: qty 200

## 2021-01-24 MED ORDER — OXYCODONE HCL 5 MG PO TABS
5.0000 mg | ORAL_TABLET | Freq: Once | ORAL | Status: AC | PRN
  Administered 2021-01-24: 5 mg via ORAL

## 2021-01-24 MED ORDER — LIDOCAINE 2% (20 MG/ML) 5 ML SYRINGE
INTRAMUSCULAR | Status: DC | PRN
  Administered 2021-01-24: 100 mg via INTRAVENOUS

## 2021-01-24 SURGICAL SUPPLY — 37 items
BAG DRN RND TRDRP ANRFLXCHMBR (UROLOGICAL SUPPLIES)
BAG URINE DRAIN 2000ML AR STRL (UROLOGICAL SUPPLIES) IMPLANT
BLADE CLIPPER SENSICLIP SURGIC (BLADE) ×4 IMPLANT
CATH FOLEY 2WAY SLVR  5CC 16FR (CATHETERS) ×1
CATH FOLEY 2WAY SLVR 5CC 16FR (CATHETERS) ×3 IMPLANT
CATH ROBINSON RED A/P 16FR (CATHETERS) IMPLANT
CATH ROBINSON RED A/P 20FR (CATHETERS) ×4 IMPLANT
CLOTH BEACON ORANGE TIMEOUT ST (SAFETY) ×4 IMPLANT
CNTNR URN SCR LID CUP LEK RST (MISCELLANEOUS) ×6 IMPLANT
CONT SPEC 4OZ STRL OR WHT (MISCELLANEOUS) ×8
COVER BACK TABLE 60X90IN (DRAPES) ×4 IMPLANT
COVER MAYO STAND STRL (DRAPES) ×4 IMPLANT
DRAPE C-ARM 35X43 STRL (DRAPES) ×4 IMPLANT
DRSG TEGADERM 4X4.75 (GAUZE/BANDAGES/DRESSINGS) ×4 IMPLANT
DRSG TEGADERM 8X12 (GAUZE/BANDAGES/DRESSINGS) ×4 IMPLANT
GAUZE SPONGE 4X4 12PLY STRL LF (GAUZE/BANDAGES/DRESSINGS) ×4 IMPLANT
GLOVE SURG ENC MOIS LTX SZ6.5 (GLOVE) ×4 IMPLANT
GLOVE SURG ENC MOIS LTX SZ7.5 (GLOVE) ×8 IMPLANT
GLOVE SURG ENC MOIS LTX SZ8 (GLOVE) ×4 IMPLANT
GLOVE SURG ORTHO LTX SZ8.5 (GLOVE) ×4 IMPLANT
GLOVE SURG POLYISO LF SZ6.5 (GLOVE) IMPLANT
GOWN STRL REUS W/TWL LRG LVL3 (GOWN DISPOSABLE) ×4 IMPLANT
HOLDER FOLEY CATH W/STRAP (MISCELLANEOUS) IMPLANT
I SEED AGX100 ×248 IMPLANT
IMPL SPACEOAR VUE SYSTEM (Spacer) ×3 IMPLANT
IMPLANT SPACEOAR VUE SYSTEM (Spacer) ×4 IMPLANT
IV NS 1000ML (IV SOLUTION) ×4
IV NS 1000ML BAXH (IV SOLUTION) ×3 IMPLANT
KIT TURNOVER CYSTO (KITS) ×4 IMPLANT
MARKER SKIN DUAL TIP RULER LAB (MISCELLANEOUS) ×4 IMPLANT
PACK CYSTO (CUSTOM PROCEDURE TRAY) ×4 IMPLANT
SURGILUBE 2OZ TUBE FLIPTOP (MISCELLANEOUS) ×4 IMPLANT
SUT BONE WAX W31G (SUTURE) IMPLANT
SYR 10ML LL (SYRINGE) ×4 IMPLANT
TOWEL OR 17X26 10 PK STRL BLUE (TOWEL DISPOSABLE) ×4 IMPLANT
UNDERPAD 30X36 HEAVY ABSORB (UNDERPADS AND DIAPERS) ×12 IMPLANT
WATER STERILE IRR 500ML POUR (IV SOLUTION) ×4 IMPLANT

## 2021-01-24 NOTE — Anesthesia Preprocedure Evaluation (Addendum)
Anesthesia Evaluation  Patient identified by MRN, date of birth, ID band Patient awake    Reviewed: Allergy & Precautions, NPO status , Patient's Chart, lab work & pertinent test results, reviewed documented beta blocker date and time   Airway Mallampati: III  TM Distance: >3 FB Neck ROM: Full    Dental  (+) Dental Advisory Given   Pulmonary sleep apnea and Continuous Positive Airway Pressure Ventilation , pneumonia, resolved, Current Smoker,    Pulmonary exam normal breath sounds clear to auscultation       Cardiovascular hypertension, Pt. on medications Normal cardiovascular exam Rhythm:Regular Rate:Normal     Neuro/Psych PSYCHIATRIC DISORDERS Anxiety Depression Mood disorder ADD Insomnia Neuromuscular disease    GI/Hepatic negative GI ROS, (+)     substance abuse  ,   Endo/Other  Obesity  Renal/GU Hx/o renal calculi   Prostate Ca    Musculoskeletal  (+) narcotic dependentChronic back pain with sciatica   Abdominal (+) + obese,   Peds  Hematology Hx/o polycythemia- donates blood q3 months   Anesthesia Other Findings   Reproductive/Obstetrics                            Anesthesia Physical Anesthesia Plan  ASA: 3  Anesthesia Plan: General   Post-op Pain Management:    Induction: Intravenous  PONV Risk Score and Plan: 2 and Treatment may vary due to age or medical condition and Ondansetron  Airway Management Planned: LMA  Additional Equipment:   Intra-op Plan:   Post-operative Plan: Extubation in OR  Informed Consent: I have reviewed the patients History and Physical, chart, labs and discussed the procedure including the risks, benefits and alternatives for the proposed anesthesia with the patient or authorized representative who has indicated his/her understanding and acceptance.     Dental advisory given  Plan Discussed with: CRNA and Anesthesiologist  Anesthesia  Plan Comments:         Anesthesia Quick Evaluation

## 2021-01-24 NOTE — Discharge Instructions (Addendum)
You will be prescribed tamsulosin which is a medication to help you urinate over the next month.  You should call Dr. Lynne Logan office [phone]) if you feel you cannot empty your bladder well. Also, call if you develop fever > 101.  You can take your usual pain medication as needed and should take an over the counter stool softener over the next week to avoid straining with bowel movements.  Followup with Dr. Alinda Money and your radiation oncologist as scheduled.   PROSTATE CANCER TREATMENT WITH RADIOACTIVE IODINE-125 SEED IMPLANT  This instruction sheet is intended to discuss implantation of Iodine-125 seeds as treatment for cancer of the prostate. It will explain in detail what you may expect from this treatment and what precautions are necessary as a result of the treatment. Iodine-125 emits a relatively low energy radiation. The radioactive seeds are surgically implanted directly into the prostate gland. Most of the radiation is contained within the prostate gland. A very small amount is present outside the body.The precautions that we ask you to take are to ensure that those around you are protected from unnecessary radiation. The principles of radiation safety that you need to understand are:  DISTANCE: The further a person is from the radioactive implant the less radiation they will be receiving. The amount of radiation received falls off quite rapidly with distance. More specific guidelines are given in the table on the last page.  TIME: The amount of radiation a person is exposed to is directly proportional to the amount of time that is spent in close proximity to the radioactive implant. Very little radiation will be received during short periods. See the table on the last page for more specific guideline.  CHILDREN UNDER AGE 12 Children should not be allowed to sit on your lap or otherwise be in very close contact for more than a few minutes for the first 6-8 weeks following the implant. You  may affectionately greet (hug/kiss) a child for a short period of time, but remember, the longer you are in close proximity with that child the more radiation they are being exposed to. At a distance of 6 feet there is no limit to the length of time you may spend together. See specific guidelines on the last page.  PREGNANT OR POSSIBLY PREGNANT WOMEN Pregnant women should avoid prolonged close physical contact with you for the first 6-8 weeks after implant. At a distance of 6 feet there is no limit to the length of time you may spend together. Pregnant women or possibly pregnant women can safely be in close contact with you for a limited period of time. See the last page for guidelines.  FAMILY RELATIONS You may sleep in the same bed as your partner (provided she is not pregnant or under the age of 51). Sexual intercourse, using a condom, may be resumed 2 weeks after the implant. Your semen may be discolored, dark brown or black. This is normal and is the result of bleeding that may have occurred during the implant. After 3-4 weeks it will not be necessary to use a condom.  DAILY ACTIVITIES You may resume normal activities in a few days (example: work, shopping, church) without the risk of harmful radiation exposure to those around you provided you keep in mind the time and distance precautions. Objects that you touch or item that you use do not become radioactive. Linens, clothing, tableware, and dishes may be used by other persons without special precautions. Your bodily wastes (urine and stool) are  not radioactive.  SPECIAL PRECAUTIONS It is possible to lose implanted Iodine-125 seed(s) through urination. Although it is possible to pass seeds indefinitely, it is most likely to occur immediately after catheter removal. To prevent this from happening the catheter that was in place during the implant procedure is removed immediately after the implant and a cystoscopy procedure is performed. The process  of removing the catheter and the cystoscopy procedure should dislodge and remove any seeds that are not firmly imbedded in the prostate tissue. However, you should watch for seeds if/when you remove your catheter at home. The seeds are silver colored and the size of a grain of rice. In the unlikely event that a seed is seen after urination, simply flush the seed down the toilet. The seed should not be handled with your fingers, not even with a glove or napkin. A spoon or tweezers can be used to pick up a seed. The Radiation Oncology department is open Monday - Friday from 8:00 am to 5:30 pm with a Radiation Oncologist on call at all times. He or she may be reached by calling [phone]. If you are to be hospitalized or if death should occur, your family should notify the Runner, broadcasting/film/video.  SIDE EFFECTS There are very few side effects associate with the implant procedure. Minor burning with urination, weak stream, hesitancy, intermittency, frequency, mild pain or feeling unable to pass your urine freely are common and usually stop in one to four months. If these symptoms are extremely uncomfortable, contact your physician.  Radioactive Seed Implant Home Care Instructions   Activity:    Rest for the remainder of the day.  Do not drive or operate equipment today.  You may resume normal  activities in a few days as instructed by your physician, without risk of harmful radiation exposure to those around you, provided you follow the time and distance precautions on the Radiation Oncology Instruction Sheet.   Meals: Drink plenty of lipuids and eat light foods, such as gelatin or soup this evening .  You may return to normal meal plan tomorrow.  Return To Work: You may return to work as instructed by Naval architect.  Special Instruction:   If any seeds are found, use tweezers to pick up seeds and place in a glass container of any kind and bring to your physician's office.  Call your physician if  any of these symptoms occur:  Persistent or heavy bleeding Urine stream diminishes or stops completely after catheter is removed Fever equal to or greater than 101 degrees F Cloudy urine with a strong foul odor Severe pain  You may feel some burning pain and/or hesitancy when you urinate after the catheter is removed.  These symptoms may increase over the next few weeks, but should diminish within forur to six weeks.  Applying moist heat to the lower abdomen or a hot tub bath may help relieve the pain.  If the discomfort becomes severe, please call your physician for additional medications.    Post Anesthesia Home Care Instructions  Activity: Get plenty of rest for the remainder of the day. A responsible individual must stay with you for 24 hours following the procedure.  For the next 24 hours, DO NOT: -Drive a car -Paediatric nurse -Drink alcoholic beverages -Take any medication unless instructed by your physician -Make any legal decisions or sign important papers.  Meals: Start with liquid foods such as gelatin or soup. Progress to regular foods as tolerated. Avoid greasy, spicy, heavy foods.  If nausea and/or vomiting occur, drink only clear liquids until the nausea and/or vomiting subsides. Call your physician if vomiting continues.  Special Instructions/Symptoms: Your throat may feel dry or sore from the anesthesia or the breathing tube placed in your throat during surgery. If this causes discomfort, gargle with warm salt water. The discomfort should disappear within 24 hours.

## 2021-01-24 NOTE — Anesthesia Procedure Notes (Signed)
Procedure Name: LMA Insertion Date/Time: 01/24/2021 12:17 PM Performed by: Mechele Claude, CRNA Pre-anesthesia Checklist: Patient identified, Emergency Drugs available, Suction available and Patient being monitored Patient Re-evaluated:Patient Re-evaluated prior to induction Oxygen Delivery Method: Circle System Utilized Preoxygenation: Pre-oxygenation with 100% oxygen Induction Type: IV induction Ventilation: Mask ventilation without difficulty LMA: LMA with gastric port inserted LMA Size: 5.0 Number of attempts: 1 Placement Confirmation: positive ETCO2 Tube secured with: Tape Dental Injury: Teeth and Oropharynx as per pre-operative assessment

## 2021-01-24 NOTE — Op Note (Signed)
Preoperative diagnosis: Clinically localized adenocarcinoma of the prostate (T1c Nx Mx)  Postoperative diagnosis: Clinically localized adenocarcinoma of the prostate  Procedure: 1) Transperineal placement of radioactive seeds into the prostate                    2) Cystoscopy                    3) Insertion of SpaceOAR hydrogel   Surgeon: Pryor Curia. M.D.  Radiation oncologist: Dr. Tyler Pita  Anesthesia: General  EBL: Minimal  Complications: None  Indication: Russell Irwin is a 65 y.o. gentleman with clinically localized prostate cancer. After discussing management options for treatment, he elected to proceed with radiotherapy. He presents today for the above procedures. The potential risks, complications, alternative options, and expected recovery course have been discussed in detail with the patient and he has provided informed consent to proceed.  Description of procedure: The patient was taken to the operating room and general anesthesia was induced. He was administered preoperative antibiotics, placed in the dorsal lithotomy position, and prepped and draped in the usual sterile fashion. Next, intraoperative transrectal ultrasonography was utilized for real-time intraoperative planning by the radiation oncology team. Once the treatment plan was completed and the seed strands created, stranded iodine 125 radiation seeds were placed utilizing a brachytherapy perineal template. 56 radioactive iodine 125 seeds into the prostate through 17 catheter needles.  The brachytherapy template was then removed.  A site in the midline was selected on the perineum for placement of an 18 g needle with saline.  The needle was advanced above the rectum and below Denonvillier's fascia to the mid gland and confirmed to be in the midline on transverse imaging.  One cc of saline was injected confirming appropriate expansion of this space.  A total of 5 cc of saline was then injected to open the  space further bilaterally.  The saline syringe was then removed and the SpaceOAR hydrogel was injected with good distribution bilaterally. Position of the radiation seeds was confirmed on fluoroscopic imaging.  Flexible cystoscopy was then performed and no seeds were identified within the bladder.  No bladder tumors, stones, or other mucosal pathology was identified within the bladder. He tolerated the procedure well and without complications. He was able to be transferred to the recovery unit in satisfactory condition.  He was given a voiding trial in the PACU.

## 2021-01-24 NOTE — Anesthesia Postprocedure Evaluation (Signed)
Anesthesia Post Note  Patient: Brenner Visconti  Procedure(s) Performed: RADIOACTIVE SEED IMPLANT/BRACHYTHERAPY IMPLANT (Prostate) SPACE OAR INSTILLATION (Rectum) CYSTOSCOPY (Bladder)     Patient location during evaluation: PACU Anesthesia Type: General Level of consciousness: awake and alert and oriented Pain management: pain level controlled Vital Signs Assessment: post-procedure vital signs reviewed and stable Respiratory status: spontaneous breathing, nonlabored ventilation and respiratory function stable Cardiovascular status: blood pressure returned to baseline and stable Postop Assessment: no apparent nausea or vomiting Anesthetic complications: no   No notable events documented.  Last Vitals:  Vitals:   01/24/21 1342 01/24/21 1345  BP: 130/72 133/67  Pulse: 97 98  Resp: 15 (!) 23  Temp: 37.1 C   SpO2: 96% 95%    Last Pain:  Vitals:   01/24/21 1345  TempSrc:   PainSc: 4                  Laruen Risser A.

## 2021-01-24 NOTE — Transfer of Care (Signed)
Immediate Anesthesia Transfer of Care Note  Patient: Russell Irwin  Procedure(s) Performed: Procedure(s) (LRB): RADIOACTIVE SEED IMPLANT/BRACHYTHERAPY IMPLANT (N/A) SPACE OAR INSTILLATION (N/A) CYSTOSCOPY (N/A)  Patient Location: PACU  Anesthesia Type: General  Level of Consciousness: awake, alert  and oriented  Airway & Oxygen Therapy: Patient Spontanous Breathing and Patient connected to nasal cannula oxygen  Post-op Assessment: Report given to PACU RN and Post -op Vital signs reviewed and stable  Post vital signs: Reviewed and stable  Complications: No apparent anesthesia complications  Last Vitals:  Vitals Value Taken Time  BP    Temp    Pulse 97 01/24/21 1342  Resp 15 01/24/21 1342  SpO2 96 % 01/24/21 1342  Vitals shown include unvalidated device data.  Last Pain:  Vitals:   01/24/21 1116  TempSrc: (P) Oral         Complications: No notable events documented.

## 2021-01-25 ENCOUNTER — Encounter (HOSPITAL_BASED_OUTPATIENT_CLINIC_OR_DEPARTMENT_OTHER): Payer: Self-pay | Admitting: Urology

## 2021-01-28 NOTE — Progress Notes (Signed)
  Radiation Oncology         [phone] ________________________________  Name: Russell Irwin MRN: [id number]  Date: 01/28/2021  DOB: 05-24-1956       Prostate Seed Implant  UO:HFGBMS, [name], FNP  [name], MD  DIAGNOSIS:  65 y.o. gentleman with Stage T1c adenocarcinoma of the prostate with Gleason score of 3+4, and PSA of 3.68  Oncology History  Malignant neoplasm of prostate ([place])  07/02/2020 Cancer Staging   Staging form: Prostate, AJCC 8th Edition - Clinical stage from 07/02/2020: Stage IIB (cT1c, cN0, cM0, PSA: 3.2, Grade Group: 2) - Signed by [name], PA-C on 10/11/2020  Histopathologic type: Adenocarcinoma, NOS  Stage prefix: Initial diagnosis  Prostate specific antigen (PSA) range: Less than 10  Gleason primary pattern: 3  Gleason secondary pattern: 4  Gleason score: 7  Histologic grading system: 5 grade system  Number of biopsy cores examined: 16  Number of biopsy cores positive: 7  Location of positive needle core biopsies: One side    10/08/2020 Initial Diagnosis   Malignant neoplasm of prostate ([place])      No diagnosis found.  PROCEDURE: Insertion of radioactive I-125 seeds into the prostate gland.  RADIATION DOSE: 145 Gy, definitive/boost therapy.  TECHNIQUE: [name] was brought to the operating room with the urologist. He was placed in the dorsolithotomy position. He was catheterized and a rectal tube was inserted. The perineum was shaved, prepped and draped. The ultrasound probe was then introduced into the rectum to see the prostate gland.  TREATMENT DEVICE: A needle grid was attached to the ultrasound probe stand and anchor needles were placed.  3D PLANNING: The prostate was imaged in 3D using a sagittal sweep of the prostate probe. These images were transferred to the planning computer. There, the prostate, urethra and rectum were defined on each axial reconstructed image. Then, the software created an optimized 3D plan and  a few seed positions were adjusted. The quality of the plan was reviewed using [facility] information for the target and the following two organs at risk:  Urethra and Rectum.  Then the accepted plan was printed and handed off to the radiation therapist.  Under my supervision, the custom loading of the seeds and spacers was carried out and loaded into sealed vicryl sleeves.  These pre-loaded needles were then placed into the needle holder.Marland Kitchen  PROSTATE VOLUME STUDY:  Using transrectal ultrasound the volume of the prostate was verified to be 31 cc.  SPECIAL TREATMENT PROCEDURE/SUPERVISION AND HANDLING: The pre-loaded needles were then delivered under sagittal guidance. A total of 17 needles were used to deposit 62 seeds in the prostate gland. The individual seed activity was 0.414 mCi.  SpaceOAR:  Yes  COMPLEX SIMULATION: At the end of the procedure, an anterior radiograph of the pelvis was obtained to document seed positioning and count. Cystoscopy was performed to check the urethra and bladder.  MICRODOSIMETRY: At the end of the procedure, the patient was emitting 0.043 mR/hr at 1 meter. Accordingly, he was considered safe for hospital discharge.  PLAN: The patient will return to the radiation oncology clinic for post implant CT dosimetry in three weeks.   ________________________________  [name], M.D.

## 2021-02-14 ENCOUNTER — Telehealth: Payer: Self-pay | Admitting: *Deleted

## 2021-02-14 NOTE — Progress Notes (Signed)
  Radiation Oncology         [phone] ________________________________  Name: Russell Irwin MRN: [id number]  Date: 02/15/2021  DOB: 1955/11/16  COMPLEX SIMULATION NOTE  NARRATIVE:  The patient was brought to the [facility] today following prostate seed implantation approximately one month ago.  Identity was confirmed.  All relevant records and images related to the planned course of therapy were reviewed.  Then, the patient was set-up supine.  CT images were obtained.  The CT images were loaded into the planning software.  Then the prostate and rectum were contoured.  Treatment planning then occurred.  The implanted iodine 125 seeds were identified by the physics staff for projection of radiation distribution  I have requested : 3D Simulation  I have requested a DVH of the following structures: Prostate and rectum.    ________________________________  [name], M.D.

## 2021-02-14 NOTE — Progress Notes (Signed)
Radiation Oncology         [phone] ________________________________  Name: Russell Irwin MRN: [id number]  Date: 02/15/2021  DOB: [birth year]  Post-Seed Follow-Up Visit Note  CC: [name], FNP  [name], MD  Diagnosis:   65 y.o. gentleman with Stage T1c adenocarcinoma of the prostate with Gleason score of 3+4, and PSA of 3.68    ICD-10-CM   1. Malignant neoplasm of prostate ([place])  C61       Interval Since Last Radiation:  3 weeks 01/24/21:  Insertion of radioactive I-125 seeds into the prostate gland; 145 Gy, definitive therapy with placement of SpaceOAR VUE gel.  Narrative:  The patient returns today for routine follow-up.  He is complaining of increased urinary frequency and urinary hesitation symptoms. He filled out a questionnaire regarding urinary function today providing and overall IPSS score of 15 characterizing his symptoms as moderate.  His pre-implant score was 4. He has some perineal irritation and continues to struggle with constipation but denies abdominal pain, nausea, vomiting or diarrhea.  He has a healthy appetite and is maintaining his weight.  He reports that his energy level is gradually improving and overall, he is pleased with his progress to date.  ALLERGIES:  is allergic to lisinopril and penicillins.  Meds: Current Outpatient Medications  Medication Sig Dispense Refill   acetaminophen (TYLENOL) 325 MG tablet Take 650 mg by mouth every 6 (six) hours as needed.     amLODipine (NORVASC) 10 MG tablet 1 tablet     Buprenorphine HCl-Naloxone HCl 8.6-2.1 MG SUBL  See Instructions, 1 film sublingual, 0 Refill(s)     Cholecalciferol (VITAMIN D3) 1.25 MG (50000 UT) CAPS Take by mouth daily.     cloNIDine (CATAPRES) 0.1 MG tablet Take 0.1 mg by mouth 2 (two) times daily.     cyclobenzaprine (FLEXERIL) 10 MG tablet Take 10 mg by mouth 3 (three) times daily as needed for muscle spasms.     docusate sodium (COLACE) 100 MG capsule Take 1 capsule (100 mg  total) by mouth 2 (two) times daily. 30 capsule 0   losartan (COZAAR) 25 MG tablet 50 mg.     Multiple Vitamins-Minerals (MULTIVITAMIN WITH MINERALS) tablet Take 1 tablet by mouth daily.     naloxone (NARCAN) nasal spray 4 mg/0.1 mL Place into the nose as needed.     Omega-3 Fatty Acids (FISH OIL) 1000 MG CAPS Take by mouth daily.     oxycodone-acetaminophen (LYNOX) 7.5-300 MG tablet Take 1 tablet by mouth every 4 (four) hours as needed for pain.     tamsulosin (FLOMAX) 0.4 MG CAPS capsule Take 1 capsule (0.4 mg total) by mouth at bedtime. 30 capsule 0   traZODone (DESYREL) 50 MG tablet Take 50 mg by mouth at bedtime as needed for sleep. 1 to 2 tabs prn     No current facility-administered medications for this visit.    Physical Findings: In general this is a well appearing Caucasian male in no acute distress. He's alert and oriented x4 and appropriate throughout the examination. Cardiopulmonary assessment is negative for acute distress and he exhibits normal effort.   Lab Findings: Lab Results  Component Value Date   WBC 6.6 01/21/2021   HGB 18.7 (H) 01/21/2021   HCT 55.9 (H) 01/21/2021   MCV 97.2 01/21/2021   PLT 173 01/21/2021    Radiographic Findings:  Patient underwent CT imaging in our clinic for post implant dosimetry. The CT will be reviewed by Dr. Tammi Klippel to confirm  there is an adequate distribution of radioactive seeds throughout the prostate gland and ensure that there are no seeds in or near the rectum. We suspect the final radiation plan and dosimetry will show appropriate coverage of the prostate gland. He understands that we will call and inform him of any unexpected findings on further review of his imaging and dosimetry.  Impression/Plan: 65 y.o. gentleman with Stage T1c adenocarcinoma of the prostate with Gleason score of 3+4, and PSA of 3.68 The patient is recovering from the effects of radiation. His urinary symptoms should gradually improve over the next 4-6 months.  We talked about this today. He is encouraged by his improvement already and is otherwise pleased with his outcome. We also talked about long-term follow-up for prostate cancer following seed implant. He understands that ongoing PSA determinations and digital rectal exams will help perform surveillance to rule out disease recurrence. He has a follow up appointment scheduled with Dr. Alinda Money on 02/20/21. He understands what to expect with his PSA measures. Patient was also educated today about some of the long-term effects from radiation including a small risk for rectal bleeding and possibly erectile dysfunction. We talked about some of the general management approaches to these potential complications. However, I did encourage the patient to contact our office or return at any point if he has questions or concerns related to his previous radiation and prostate cancer.    Russell Johns, PA-C

## 2021-02-14 NOTE — Telephone Encounter (Signed)
CALLED PATIENT TO REMIND OF POST SEED APPTS. FOR 02-15-21, SPOKE WITH PATIENT AND HE IS AWARE OF THESE APPTS.

## 2021-02-15 ENCOUNTER — Encounter: Payer: Self-pay | Admitting: Urology

## 2021-02-15 ENCOUNTER — Ambulatory Visit
Admission: RE | Admit: 2021-02-15 | Discharge: 2021-02-15 | Disposition: A | Discharge status: Home / Self Care | Payer: Medicare PPO | Source: Ambulatory Visit | Attending: Urology | Admitting: Urology

## 2021-02-15 ENCOUNTER — Ambulatory Visit
Admission: RE | Admit: 2021-02-15 | Discharge: 2021-02-15 | Disposition: A | Discharge status: Home / Self Care | Payer: Medicare PPO | Source: Ambulatory Visit | Attending: Radiation Oncology | Admitting: Radiation Oncology

## 2021-02-15 ENCOUNTER — Other Ambulatory Visit: Payer: Self-pay

## 2021-02-15 VITALS — BP 158/94 | HR 110 | Temp 97.7°F | Ht 75.0 in | Wt 286.6 lb

## 2021-02-15 DIAGNOSIS — C61 Malignant neoplasm of prostate: Secondary | ICD-10-CM | POA: Insufficient documentation

## 2021-02-15 NOTE — Progress Notes (Signed)
Patient in for post seed implant continues to have some pain in the pelvic rectal area 2/10. AUA score is 15. No other issues noted.

## 2021-04-04 ENCOUNTER — Encounter: Payer: Self-pay | Admitting: Radiation Oncology

## 2021-04-04 ENCOUNTER — Ambulatory Visit
Admission: RE | Admit: 2021-04-04 | Discharge: 2021-04-04 | Disposition: A | Discharge status: Home / Self Care | Payer: Medicare PPO | Source: Ambulatory Visit | Attending: Radiation Oncology | Admitting: Radiation Oncology

## 2021-04-04 DIAGNOSIS — C61 Malignant neoplasm of prostate: Secondary | ICD-10-CM | POA: Diagnosis present

## 2021-04-08 NOTE — Progress Notes (Signed)
  Radiation Oncology         [phone] ________________________________  Name: Russell Irwin MRN: [id number]  Date: 04/04/2021  DOB: 03/08/1956  3D Planning Note   Prostate Brachytherapy Post-Implant Dosimetry  Diagnosis: 65 y.o. gentleman with Stage T1c adenocarcinoma of the prostate with Gleason score of 3+4, and PSA of 3.68.  Narrative: On a previous date, Russell Irwin returned following prostate seed implantation for post implant planning. He underwent CT scan complex simulation to delineate the three-dimensional structures of the pelvis and demonstrate the radiation distribution.  Since that time, the seed localization, and complex isodose planning with dose volume histograms have now been completed.  Results:   Prostate Coverage - The dose of radiation delivered to the 90% or more of the prostate gland (D90) was 88.77% of the prescription dose. This approaches our goal of greater than 90% and coverage is excellent in the peripheral lobe. Rectal Sparing - The volume of rectal tissue receiving the prescription dose or higher was 0.0 cc. This falls under our thresholds tolerance of 1.0 cc.  Impression: The prostate seed implant appears to show adequate target coverage and appropriate rectal sparing.  Plan:  The patient will continue to follow with urology for ongoing PSA determinations. I would anticipate a high likelihood for local tumor control with minimal risk for rectal morbidity.  ________________________________  [name], M.D.

## 2022-09-01 IMAGING — DX DG CHEST 2V
2 series · 2 of 2 positions shown · non-contrast
Comparison: None.

CLINICAL DATA: Preop radioactive seed implantation.

EXAM:
CHEST - 2 VIEW

[chest pa]
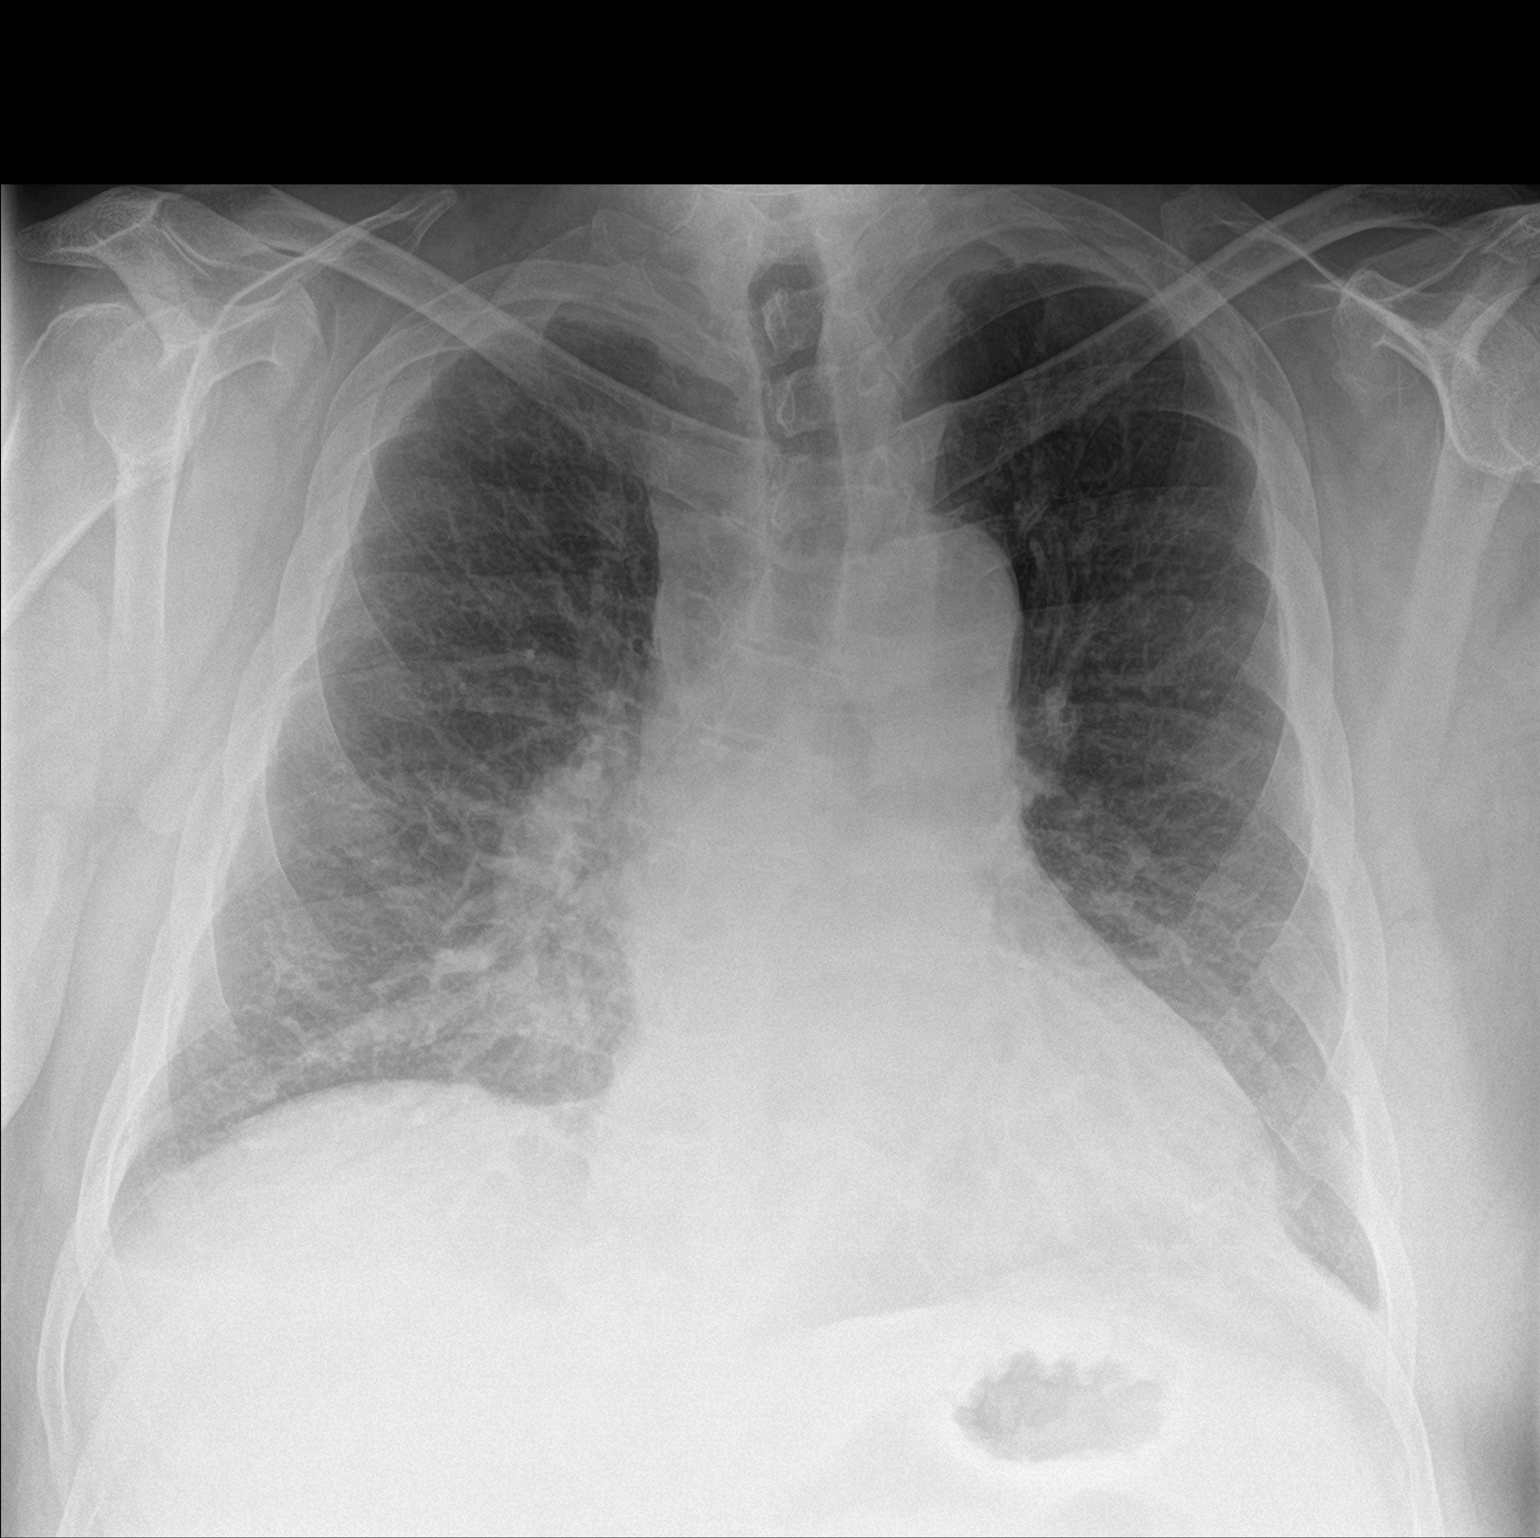

[chest lat]
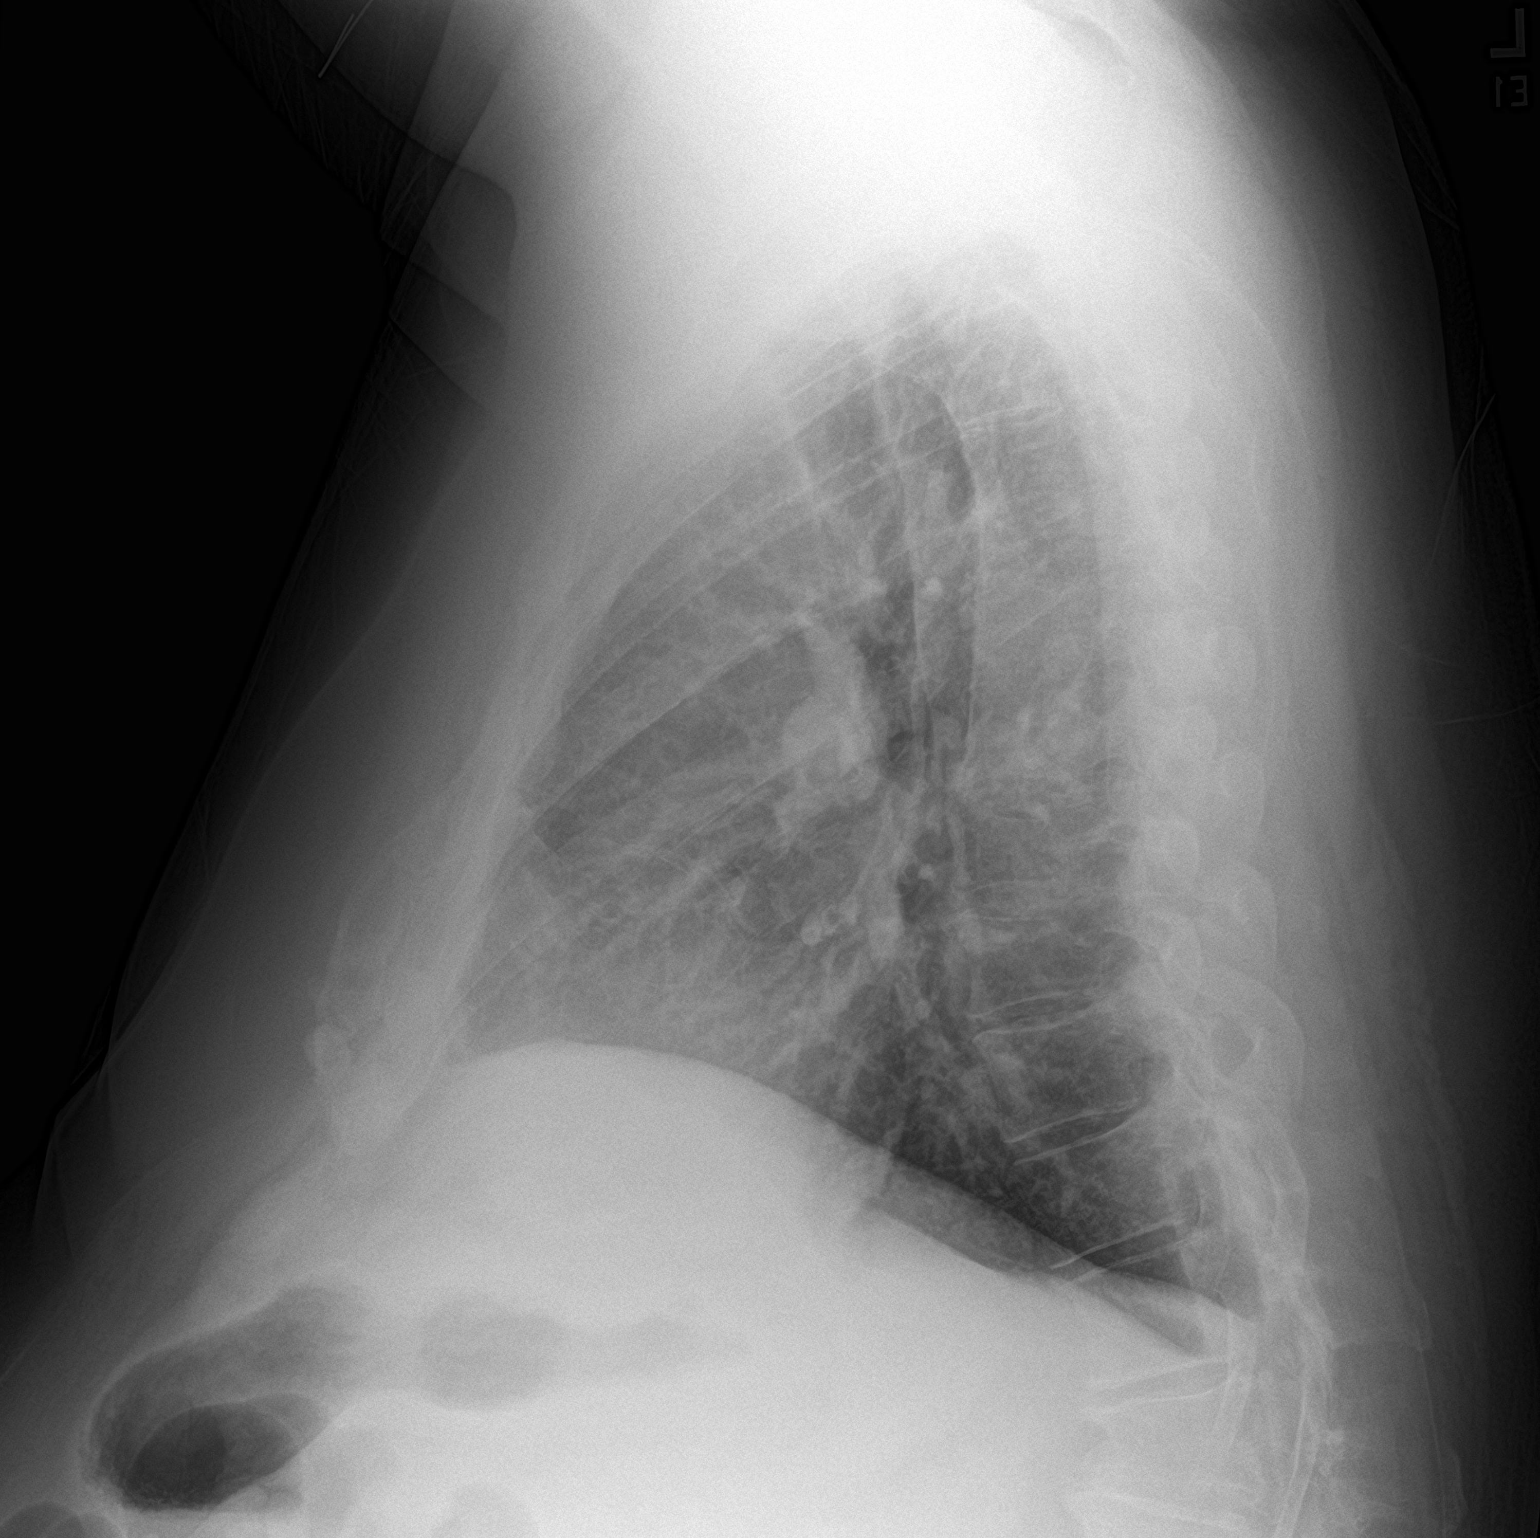

[2 of 2 positions shown; findings below may reference images not displayed]

FINDINGS: Heart size upper normal. Negative for heart failure. Prominent lung
markings especially in the bases likely chronic. No acute infiltrate
effusion or mass. Mild pleural thickening in the apices bilaterally.

Thoracic lordosis.  No acute skeletal abnormality.
IMPRESSION: Prominent lung markings likely chronic. No acute cardiopulmonary
abnormality.
